# Patient Record
Sex: Male | Born: 1985 | Race: Black or African American | Hispanic: No | Marital: Married | Smoking: Current every day smoker
Health system: Southern US, Community
[De-identification: ages and names within clinical notes are randomized; demographics above are authoritative.]

---
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
## 2012-05-29 ENCOUNTER — Emergency Department (HOSPITAL_COMMUNITY)
Admission: EM | Admit: 2012-05-29 | Discharge: 2012-05-29 | Disposition: A | Discharge status: Home / Self Care | Payer: 59 | Source: Home / Self Care

## 2012-05-29 ENCOUNTER — Encounter (HOSPITAL_COMMUNITY): Payer: Self-pay | Admitting: Emergency Medicine

## 2012-05-29 DIAGNOSIS — M778 Other enthesopathies, not elsewhere classified: Secondary | ICD-10-CM

## 2012-05-29 DIAGNOSIS — M65849 Other synovitis and tenosynovitis, unspecified hand: Secondary | ICD-10-CM

## 2012-05-29 DIAGNOSIS — M65839 Other synovitis and tenosynovitis, unspecified forearm: Secondary | ICD-10-CM

## 2012-05-29 NOTE — ED Provider Notes (Signed)
Medical screening examination/treatment/procedure(s) were performed by non-physician practitioner and as supervising physician I was immediately available for consultation/collaboration.  Leslee Home, M.D.   Reuben Likes, MD 05/29/12 [phone]

## 2012-05-29 NOTE — ED Provider Notes (Signed)
History     CSN: [id number]  Arrival date & time 05/29/12  1256   First MD Initiated Contact with Patient 05/29/12 1358      Chief Complaint  Patient presents with  . Hand Pain    (Consider location/radiation/quality/duration/timing/severity/associated sxs/prior treatment) HPI Comments: 26 year old male was playing basketball yesterday and during play he began to experience pain in the back of the right hand the thenar eminence and the wrist. Overnight and as the day progressed today the pain persisted and swelling developed in the dorsum of the hand. He is unsure of the mechanism of injury. He states that there was no overt stretching or blunt trauma or fall or other known injury to the extremity.   History reviewed. No pertinent past medical history.  History reviewed. No pertinent past surgical history.  No family history on file.  History  Substance Use Topics  . Smoking status: Current Every Day Smoker -- 0.5 packs/day    Types: Cigarettes  . Smokeless tobacco: Not on file  . Alcohol Use: No      Review of Systems  Constitutional: Negative.   Respiratory: Negative.   Gastrointestinal: Negative.   Genitourinary: Negative.   Musculoskeletal:       As per HPI  Skin: Negative.   Neurological: Negative for dizziness, weakness, numbness and headaches.  Psychiatric/Behavioral: Negative.     Allergies  Coconut flavor  Home Medications  No current outpatient prescriptions on file.  BP 123/74  Pulse 72  Temp 99 F (37.2 C) (Oral)  Resp 18  SpO2 98%  Physical Exam  Nursing note and vitals reviewed. Constitutional: He is oriented to person, place, and time. He appears well-developed and well-nourished.  HENT:  Head: Normocephalic and atraumatic.  Eyes: EOM are normal. Left eye exhibits no discharge.  Neck: Normal range of motion. Neck supple.  Pulmonary/Chest: Effort normal.  Musculoskeletal:       There is pain and tenderness and mild edema in the dorsum  of the hand as well as the volar and extensor surface of the wrist. There is no edema over the wrist. No bony tenderness or deformity. Mild tenderness over the thenar eminence There is full range of motion of the digits and the thumb and the wrist. Distal neurovascular motor sensory is intact radial pulses 2+.  Neurological: He is alert and oriented to person, place, and time. No cranial nerve deficit.  Skin: Skin is warm and dry.  Psychiatric: He has a normal mood and affect.    ED Course  Procedures (including critical care time)  Labs Reviewed - No data to display No results found.   1. Tendinitis of left wrist   2. Tendinitis of right hand       MDM  Place right wrist in a Velcro wrist splint. Wear this for 7-10 days possibly longer if still having hand or wrist pain. Ice over the dorsum of the hand at the wrist periodically. Motrin 600 mg every 6 hours p.c. when necessary pain May return if not improved or may follow up with the on-call orthopedist, see name a referral.         Hayden Rasmussen, NP 05/29/12 1525  Hayden Rasmussen, NP 05/29/12 1525

## 2012-05-29 NOTE — ED Notes (Addendum)
Pt c/o right hand pain since yesterday... First noticed after basketball game that he was having pain when tieing shoe and putting on jacket... Sx include: swelling, pain w/activity, limited range of motion... Denies: inj/trauma to site, fevers, vomiting, nauseas, diarrhea... Pt is alert w/no signs of distress.

## 2013-07-10 ENCOUNTER — Emergency Department (INDEPENDENT_AMBULATORY_CARE_PROVIDER_SITE_OTHER): Payer: 59

## 2013-07-10 ENCOUNTER — Encounter (HOSPITAL_COMMUNITY): Payer: Self-pay | Admitting: Emergency Medicine

## 2013-07-10 ENCOUNTER — Emergency Department (HOSPITAL_COMMUNITY)
Admission: EM | Admit: 2013-07-10 | Discharge: 2013-07-10 | Disposition: A | Discharge status: Home / Self Care | Payer: 59 | Source: Home / Self Care | Attending: Emergency Medicine | Admitting: Emergency Medicine

## 2013-07-10 DIAGNOSIS — J111 Influenza due to unidentified influenza virus with other respiratory manifestations: Secondary | ICD-10-CM

## 2013-07-10 LAB — POCT RAPID STREP A: Streptococcus, Group A Screen (Direct): NEGATIVE

## 2013-07-10 MED ORDER — ACETAMINOPHEN 325 MG PO TABS
650.0000 mg | ORAL_TABLET | Freq: Once | ORAL | Status: AC
  Administered 2013-07-10: 650 mg via ORAL

## 2013-07-10 MED ORDER — OSELTAMIVIR PHOSPHATE 75 MG PO CAPS: 75.0000 mg | ORAL_CAPSULE | Freq: Two times a day (BID) | ORAL | Status: DC

## 2013-07-10 MED ORDER — TRAMADOL HCL 50 MG PO TABS: 100.0000 mg | ORAL_TABLET | Freq: Three times a day (TID) | ORAL | Status: DC | PRN

## 2013-07-10 MED ORDER — HYDROCOD POLST-CHLORPHEN POLST 10-8 MG/5ML PO LQCR: 5.0000 mL | Freq: Two times a day (BID) | ORAL | Status: DC | PRN

## 2013-07-10 MED ORDER — ACETAMINOPHEN 325 MG PO TABS
ORAL_TABLET | ORAL | Status: AC
  Filled 2013-07-10: qty 2

## 2013-07-10 NOTE — ED Provider Notes (Signed)
Chief Complaint   Chief Complaint  Patient presents with  . URI    History of Present Illness   Demeco Ducksworth IV is a 27 year old male who had sudden onset yesterday of myalgias, headache, felt hot and cold, and had a temperature of 101.5, chills, and sweats. He also had nausea, diarrhea, dry cough, sore throat, and nasal congestion. He denies any vomiting or abdominal pain. 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  Review of Systems   Other than as noted above, the patient denies any of the following symptoms: Systemic:  No fevers, chills, sweats, or myalgias. Eye:  No redness or discharge. ENT:  No ear pain, headache, nasal congestion, drainage, sinus pressure, or sore throat. Neck:  No neck pain, stiffness, or swollen glands. Lungs:  No cough, sputum production, hemoptysis, wheezing, chest tightness, shortness of breath or chest pain. GI:  No abdominal pain, nausea, vomiting or diarrhea.  PMFSH   Past medical history, family history, social history, meds, and allergies were reviewed.  Physical exam   Vital signs:  BP 128/80  Pulse 110  Temp(Src) 101.5 F (38.6 C) (Oral)  Resp 18  SpO2 100% General:  Alert and oriented.  In no distress.  Skin warm and dry. Eye:  No conjunctival injection or drainage. Lids were normal. ENT:  TMs and canals were normal, without erythema or inflammation.  Nasal mucosa was clear and uncongested, without drainage.  Mucous membranes were moist.  Pharynx was clear with no exudate or drainage.  There were no oral ulcerations or lesions. Neck:  Supple, no adenopathy, tenderness or mass. Lungs:  No respiratory distress.  Lungs were clear to auscultation, without wheezes, rales or rhonchi.  Breath sounds were clear and equal bilaterally.  Heart:  Regular rhythm, without gallops, murmers or rubs. Skin:  Clear, warm, and dry, without rash or lesions.  Labs   Results for orders placed during the hospital  encounter of 07/10/13  POCT RAPID STREP A ([facility] ONLY)      Result Value Range   Streptococcus, Group A Screen (Direct) NEGATIVE  NEGATIVE     Radiology    Dg Chest 2 View  07/10/2013   CLINICAL DATA:  Cough and congestion  EXAM: CHEST  2 VIEW  COMPARISON:  None.  FINDINGS: The lungs are clear. Heart size and pulmonary vascularity are normal. No adenopathy. No bone lesions. There is an azygos lobe on the right, an anatomic variant.  IMPRESSION: No edema or consolidation.   Electronically Signed   By: Bretta Bang M.D.   On: 07/10/2013 16:20   Assessment     The encounter diagnosis was Influenza-like illness.   Plan    1.  Meds:  The following meds were prescribed:   Discharge Medication List as of 07/10/2013  4:46 PM    START taking these medications   Details  chlorpheniramine-HYDROcodone (TUSSIONEX) 10-8 MG/5ML LQCR Take 5 mLs by mouth every 12 (twelve) hours as needed for cough., Starting 07/10/2013, Until Discontinued, Normal    oseltamivir (TAMIFLU) 75 MG capsule Take 1 capsule (75 mg total) by mouth every 12 (twelve) hours., Starting 07/10/2013, Until Discontinued, Normal    traMADol (ULTRAM) 50 MG tablet Take 2 tablets (100 mg total) by mouth every 8 (eight) hours as needed., Starting 07/10/2013, Until Discontinued, Normal        2.  Patient Education/Counseling:  The patient was given appropriate handouts, self care instructions, and instructed in  symptomatic relief.  Instructed to get extra fluids, rest, and use a cool mist vaporizer.   3.  Follow up:  The patient was told to follow up here if no better in 3 to 4 days, or sooner if becoming worse in any way, and given some red flag symptoms such as increasing fever, difficulty breathing, chest pain, or persistent vomiting which would prompt immediate return.  Follow up here as needed.      Reuben Likes, MD 07/10/13 [phone]

## 2013-07-10 NOTE — ED Notes (Signed)
Generalized aching, particularly back and legs , reports fever, cough and minimally sore throat.

## 2013-07-12 LAB — CULTURE, GROUP A STREP

## 2017-06-30 ENCOUNTER — Emergency Department (HOSPITAL_COMMUNITY)
Admission: EM | Admit: 2017-06-30 | Discharge: 2017-06-30 | Disposition: A | Discharge status: Home / Self Care | Payer: No Typology Code available for payment source | Attending: Emergency Medicine | Admitting: Emergency Medicine

## 2017-06-30 ENCOUNTER — Encounter (HOSPITAL_COMMUNITY): Payer: Self-pay

## 2017-06-30 ENCOUNTER — Emergency Department (HOSPITAL_COMMUNITY): Payer: No Typology Code available for payment source

## 2017-06-30 DIAGNOSIS — Y939 Activity, unspecified: Secondary | ICD-10-CM | POA: Insufficient documentation

## 2017-06-30 DIAGNOSIS — F17210 Nicotine dependence, cigarettes, uncomplicated: Secondary | ICD-10-CM | POA: Diagnosis not present

## 2017-06-30 DIAGNOSIS — S161XXA Strain of muscle, fascia and tendon at neck level, initial encounter: Secondary | ICD-10-CM | POA: Diagnosis not present

## 2017-06-30 DIAGNOSIS — Y92410 Unspecified street and highway as the place of occurrence of the external cause: Secondary | ICD-10-CM | POA: Diagnosis not present

## 2017-06-30 DIAGNOSIS — Y998 Other external cause status: Secondary | ICD-10-CM | POA: Insufficient documentation

## 2017-06-30 DIAGNOSIS — S199XXA Unspecified injury of neck, initial encounter: Secondary | ICD-10-CM | POA: Diagnosis present

## 2017-06-30 MED ORDER — CYCLOBENZAPRINE HCL 10 MG PO TABS: 10.0000 mg | ORAL_TABLET | Freq: Two times a day (BID) | ORAL | 0 refills | Status: DC | PRN

## 2017-06-30 MED ORDER — NAPROXEN 500 MG PO TABS: 500.0000 mg | ORAL_TABLET | Freq: Two times a day (BID) | ORAL | 0 refills | Status: DC
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
## 2017-06-30 NOTE — Discharge Instructions (Signed)
Please read the attached information regarding your condition. Take Flexeril and naproxen as directed. Apply heat and stretch area of neck as tolerated. [facility]. Return to ED for worsening symptoms, additional injuries, head injuries, loss of consciousness, chest pain or shortness of breath.

## 2017-12-04 ENCOUNTER — Encounter (HOSPITAL_BASED_OUTPATIENT_CLINIC_OR_DEPARTMENT_OTHER): Payer: Self-pay

## 2017-12-04 ENCOUNTER — Other Ambulatory Visit: Payer: Self-pay

## 2017-12-04 ENCOUNTER — Emergency Department (HOSPITAL_BASED_OUTPATIENT_CLINIC_OR_DEPARTMENT_OTHER)
Admission: EM | Admit: 2017-12-04 | Discharge: 2017-12-04 | Disposition: A | Discharge status: Home / Self Care | Payer: No Typology Code available for payment source | Attending: Emergency Medicine | Admitting: Emergency Medicine

## 2017-12-04 DIAGNOSIS — Z79899 Other long term (current) drug therapy: Secondary | ICD-10-CM | POA: Insufficient documentation

## 2017-12-04 DIAGNOSIS — R591 Generalized enlarged lymph nodes: Secondary | ICD-10-CM

## 2017-12-04 DIAGNOSIS — F17210 Nicotine dependence, cigarettes, uncomplicated: Secondary | ICD-10-CM | POA: Diagnosis not present

## 2017-12-04 DIAGNOSIS — R1030 Lower abdominal pain, unspecified: Secondary | ICD-10-CM | POA: Diagnosis not present

## 2017-12-04 NOTE — ED Notes (Signed)
Bilateral groin aching at work

## 2017-12-04 NOTE — ED Notes (Signed)
ED Provider at bedside. 

## 2017-12-04 NOTE — ED Triage Notes (Addendum)
Pt c/o bilat groin pain-started after lifting at work approx 1130pm last night-NAD-steady gait-states he does not need UDS

## 2017-12-04 NOTE — ED Notes (Signed)
Pt verbalizes understanding of d/c instructions and denies any further needs at this time. 

## 2017-12-05 NOTE — ED Provider Notes (Signed)
[facility] EMERGENCY DEPARTMENT Provider Note   CSN: [id number] Arrival date & time: 12/04/17  2204     History   Chief Complaint Chief Complaint  Patient presents with  . Groin Pain    HPI Dylan Kemp is a 32 y.o. male.  HPI Patient is a 32 year old male who reports pain in his bilateral groin over the past several days.  He states is been worse with walking and worse with sitting down.  He feels as though he has swollen lymph nodes.  This became more painful while at work tonight.  No fevers or chills.  No dysuria.  No scrotal pain or swelling.  Denies abdominal pain.  No weight changes.  No history of cancer.   History reviewed. No pertinent past medical history.  There are no active problems to display for this patient.   History reviewed. No pertinent surgical history.      Home Medications    Prior to Admission medications   Medication Sig Start Date End Date Taking? Authorizing Provider  chlorpheniramine-HYDROcodone (TUSSIONEX) 10-8 MG/5ML LQCR Take 5 mLs by mouth every 12 (twelve) hours as needed for cough. 07/10/13   Reuben Likes, MD  cyclobenzaprine (FLEXERIL) 10 MG tablet Take 1 tablet (10 mg total) by mouth 2 (two) times daily as needed for muscle spasms. 06/30/17   Khatri, Hina, PA-C  naproxen (NAPROSYN) 500 MG tablet Take 1 tablet (500 mg total) by mouth 2 (two) times daily. 06/30/17   Khatri, Hina, PA-C  oseltamivir (TAMIFLU) 75 MG capsule Take 1 capsule (75 mg total) by mouth every 12 (twelve) hours. 07/10/13   Reuben Likes, MD  traMADol (ULTRAM) 50 MG tablet Take 2 tablets (100 mg total) by mouth every 8 (eight) hours as needed. 07/10/13   Reuben Likes, MD    Family History No family history on file.  Social History Social History   Tobacco Use  . Smoking status: Current Every Day Smoker    Packs/day: 0.50    Types: Cigarettes  . Smokeless tobacco: Never Used  Substance Use Topics  . Alcohol use: No  . Drug use: No       Allergies   Coconut flavor [flavoring agent]   Review of Systems Review of Systems  All other systems reviewed and are negative.    Physical Exam Updated Vital Signs BP 130/74 (BP Location: Left Arm)   Pulse 84   Temp 98.2 F (36.8 C) (Oral)   Resp 16   Ht  (1.753 m)   Wt 71.7 kg (158 lb)   SpO2 97%   BMI 23.33 kg/m   Physical Exam  Constitutional: He is oriented to person, place, and time. He appears well-developed and well-nourished.  HENT:  Head: Normocephalic.  Eyes: EOM are normal.  Neck: Normal range of motion.  Pulmonary/Chest: Effort normal.  Abdominal: He exhibits no distension.  Genitourinary:  Genitourinary Comments: Normal external genitalia.  Circumcised penis.  Scrotum normal.  No hernias palpable bilaterally.  Musculoskeletal: Normal range of motion.  Full range of motion of lower extremity major joints.  Normal pulses in his bilateral feet.  Bilateral inguinal lymphadenopathy present.  This is shoddy and small.  No overlying skin changes.  Equal bilaterally  Neurological: He is alert and oriented to person, place, and time.  Psychiatric: He has a normal mood and affect.  Nursing note and vitals reviewed.    ED Treatments / Results  Labs (all labs ordered are listed, but only abnormal  results are displayed) Labs Reviewed - No data to display  EKG None  Radiology No results found.  Procedures Procedures (including critical care time)  Medications Ordered in ED Medications - No data to display   Initial Impression / Assessment and Plan / ED Course  I have reviewed the triage vital signs and the nursing notes.  Pertinent labs & imaging results that were available during my care of the patient were reviewed by me and considered in my medical decision making (see chart for details).     Bilateral inguinal lymphadenopathy.  Nonspecific.  Recommend anti-inflammatories and time.  Primary care follow-up.  If his symptoms persist he  may benefit from a biopsy.  At this point I do not think he needs blood work.  Is only been present for several days.  If this persists will benefit from blood work as well  Final Clinical Impressions(s) / ED Diagnoses   Final diagnoses:  Lymphadenopathy    ED Discharge Orders    None       Azalia Bilis, MD 12/05/17 [phone]

## 2018-05-22 ENCOUNTER — Emergency Department (HOSPITAL_COMMUNITY)
Admission: EM | Admit: 2018-05-22 | Discharge: 2018-05-22 | Disposition: A | Discharge status: Home / Self Care | Payer: Self-pay | Attending: Emergency Medicine | Admitting: Emergency Medicine

## 2018-05-22 ENCOUNTER — Other Ambulatory Visit: Payer: Self-pay

## 2018-05-22 ENCOUNTER — Encounter (HOSPITAL_COMMUNITY): Payer: Self-pay | Admitting: Emergency Medicine

## 2018-05-22 DIAGNOSIS — F17210 Nicotine dependence, cigarettes, uncomplicated: Secondary | ICD-10-CM | POA: Insufficient documentation

## 2018-05-22 DIAGNOSIS — M545 Low back pain, unspecified: Secondary | ICD-10-CM

## 2018-05-22 DIAGNOSIS — Z79899 Other long term (current) drug therapy: Secondary | ICD-10-CM | POA: Insufficient documentation

## 2018-05-22 MED ORDER — CYCLOBENZAPRINE HCL 10 MG PO TABS: 10.0000 mg | ORAL_TABLET | Freq: Two times a day (BID) | ORAL | 0 refills | Status: AC | PRN

## 2018-05-22 MED ORDER — NAPROXEN 500 MG PO TABS: 500.0000 mg | ORAL_TABLET | Freq: Two times a day (BID) | ORAL | 0 refills | Status: AC

## 2018-05-22 MED ORDER — HYDROCODONE-ACETAMINOPHEN 5-325 MG PO TABS: 1.0000 | ORAL_TABLET | Freq: Four times a day (QID) | ORAL | 0 refills | Status: AC | PRN

## 2018-05-22 NOTE — ED Provider Notes (Signed)
[facility] EMERGENCY DEPARTMENT Provider Note   CSN: [id number] Arrival date & time: 05/22/18  2019     History   Chief Complaint Chief Complaint  Patient presents with  . Back Pain    HPI Dylan Kemp is a 32 y.o. male.  Patient complaint of low back pain midline nonradiating starting Tuesday or Wednesday.  No injury or work-related injury.  Patient states he been trying to treat the pain with icy hot patches.  He went into work tonight it was difficult for him to perform his work tasks he was sent in for further evaluation.  Patient has not had any back problems in the past.  Patient denies any numbness or weakness to his feet.  Pain does not radiate into his legs.  No difficulty urinating.     History reviewed. No pertinent past medical history.  There are no active problems to display for this patient.   History reviewed. No pertinent surgical history.      Home Medications    Prior to Admission medications   Medication Sig Start Date End Date Taking? Authorizing Provider  ibuprofen (ADVIL,MOTRIN) 200 MG tablet Take 200 mg by mouth every 6 (six) hours as needed for mild pain or moderate pain.   Yes [provider]  cyclobenzaprine (FLEXERIL) 10 MG tablet Take 1 tablet (10 mg total) by mouth 2 (two) times daily as needed for muscle spasms. 05/22/18   Vanetta Mulders, MD  HYDROcodone-acetaminophen (NORCO/VICODIN) 5-325 MG tablet Take 1-2 tablets by mouth every 6 (six) hours as needed. 05/22/18   Vanetta Mulders, MD  naproxen (NAPROSYN) 500 MG tablet Take 1 tablet (500 mg total) by mouth 2 (two) times daily. 05/22/18   Vanetta Mulders, MD    Family History No family history on file.  Social History Social History   Tobacco Use  . Smoking status: Current Every Day Smoker    Packs/day: 0.50    Types: Cigarettes  . Smokeless tobacco: Never Used  Substance Use Topics  . Alcohol use: No  . Drug use: No     Allergies   Coconut flavor  [flavoring agent]   Review of Systems Review of Systems  Constitutional: Negative for fever.  HENT: Negative for congestion.   Eyes: Negative for redness.  Respiratory: Negative for shortness of breath.   Cardiovascular: Negative for chest pain.  Gastrointestinal: Negative for abdominal pain.  Genitourinary: Negative for difficulty urinating.  Musculoskeletal: Positive for back pain.  Skin: Negative for rash.  Neurological: Negative for weakness and numbness.  Hematological: Does not bruise/bleed easily.  Psychiatric/Behavioral: Negative for confusion.     Physical Exam Updated Vital Signs BP 130/75 (BP Location: Right Arm)   Pulse 92   Temp 97.8 F (36.6 C) (Oral)   Resp 18   Wt 72.6 kg   SpO2 98%   BMI 23.63 kg/m   Physical Exam  Constitutional: He is oriented to person, place, and time. He appears well-developed and well-nourished. No distress.  HENT:  Head: Normocephalic and atraumatic.  Mouth/Throat: Oropharynx is clear and moist.  Eyes: Pupils are equal, round, and reactive to light. Conjunctivae and EOM are normal.  Neck: Normal range of motion. Neck supple.  Cardiovascular: Normal rate and regular rhythm.  Pulmonary/Chest: Effort normal and breath sounds normal.  Abdominal: Soft. Bowel sounds are normal. There is no tenderness.  Musculoskeletal: Normal range of motion. He exhibits no edema.  Neurological: He is alert and oriented to person, place, and time. No cranial nerve deficit  or sensory deficit. He exhibits normal muscle tone. Coordination normal.  Skin: Skin is warm.  Nursing note and vitals reviewed.    ED Treatments / Results  Labs (all labs ordered are listed, but only abnormal results are displayed) Labs Reviewed - No data to display  EKG None  Radiology No results found.  Procedures Procedures (including critical care time)  Medications Ordered in ED Medications - No data to display   Initial Impression / Assessment and Plan / ED  Course  I have reviewed the triage vital signs and the nursing notes.  Pertinent labs & imaging results that were available during my care of the patient were reviewed by me and considered in my medical decision making (see chart for details).     Patient with complaint of lumbar back pain midline since Tuesday or Wednesday.  No trauma or injury.  Patient denies any radiation of pain in the legs no numbness or weakness to lower extremities or feet.  No prior history of any back problems.  Patient has been using icy hot patches without any significant improvement.  Patient went to work tonight and in the back to much so he was sent in for further evaluation.  Patient will be treated with a seven-day course of Naprosyn and Flexeril and a short course of hydrocodone.  Work note provided.  Final Clinical Impressions(s) / ED Diagnoses   Final diagnoses:  Acute midline low back pain without sciatica    ED Discharge Orders         Ordered    naproxen (NAPROSYN) 500 MG tablet  2 times daily     05/22/18 2144    cyclobenzaprine (FLEXERIL) 10 MG tablet  2 times daily PRN     05/22/18 2144    HYDROcodone-acetaminophen (NORCO/VICODIN) 5-325 MG tablet  Every 6 hours PRN     05/22/18 2144           Vanetta Mulders, MD 05/22/18 2151

## 2018-05-22 NOTE — ED Triage Notes (Signed)
Pt c/o lower center back pain x 4 days, denies injury, has had some relief with [facility] patches

## 2018-05-22 NOTE — Discharge Instructions (Addendum)
Take the Naprosyn on a regular basis for the next 7 days.  Take the Flexeril as directed but definitely take it at nighttime.  It will make you sleepy.  Help you relax.  Take the hydrocodone as needed for additional pain relief.  Work note provided to be out of work until Tuesday.  Return for any new or worse symptoms.  If symptoms persist additional work-up with x-rays of the back or possible MRI of the back would be appropriate.

## 2018-12-24 ENCOUNTER — Emergency Department (HOSPITAL_COMMUNITY)
Admission: EM | Admit: 2018-12-24 | Discharge: 2018-12-24 | Disposition: A | Discharge status: Home / Self Care | Payer: No Typology Code available for payment source | Attending: Emergency Medicine | Admitting: Emergency Medicine

## 2018-12-24 ENCOUNTER — Encounter (HOSPITAL_COMMUNITY): Payer: Self-pay | Admitting: Emergency Medicine

## 2018-12-24 ENCOUNTER — Other Ambulatory Visit: Payer: Self-pay

## 2018-12-24 ENCOUNTER — Emergency Department (HOSPITAL_COMMUNITY): Payer: No Typology Code available for payment source

## 2018-12-24 DIAGNOSIS — X500XXA Overexertion from strenuous movement or load, initial encounter: Secondary | ICD-10-CM | POA: Insufficient documentation

## 2018-12-24 DIAGNOSIS — S46911A Strain of unspecified muscle, fascia and tendon at shoulder and upper arm level, right arm, initial encounter: Secondary | ICD-10-CM | POA: Diagnosis not present

## 2018-12-24 DIAGNOSIS — Y9289 Other specified places as the place of occurrence of the external cause: Secondary | ICD-10-CM | POA: Diagnosis not present

## 2018-12-24 DIAGNOSIS — Y990 Civilian activity done for income or pay: Secondary | ICD-10-CM | POA: Insufficient documentation

## 2018-12-24 DIAGNOSIS — F17210 Nicotine dependence, cigarettes, uncomplicated: Secondary | ICD-10-CM | POA: Diagnosis not present

## 2018-12-24 DIAGNOSIS — S4992XA Unspecified injury of left shoulder and upper arm, initial encounter: Secondary | ICD-10-CM | POA: Diagnosis present

## 2018-12-24 DIAGNOSIS — Y9389 Activity, other specified: Secondary | ICD-10-CM | POA: Insufficient documentation

## 2018-12-24 NOTE — ED Notes (Signed)
Patient transported to X-ray 

## 2018-12-24 NOTE — ED Provider Notes (Signed)
[place] EMERGENCY DEPARTMENT Provider Note   CSN: [id number] Arrival date & time: 12/24/18  1201    History   Chief Complaint Chief Complaint  Patient presents with  . Shoulder Pain    HPI Dylan Kemp is a 33 y.o. male.     HPI    33 year old male presents today with complaints of right shoulder pain.  Patient notes yesterday at work he was lifting boxes when he felt a pull in his right shoulder.  He notes pain with abduction at the shoulder and flexion as well.  He denies any loss of distal sensation strength and motor function.  No medications prior to arrival.    History reviewed. No pertinent past medical history.  There are no active problems to display for this patient.   History reviewed. No pertinent surgical history.      Home Medications    Prior to Admission medications   Medication Sig Start Date End Date Taking? Authorizing Provider  cyclobenzaprine (FLEXERIL) 10 MG tablet Take 1 tablet (10 mg total) by mouth 2 (two) times daily as needed for muscle spasms. 05/22/18   Fredia Sorrow, MD  HYDROcodone-acetaminophen (NORCO/VICODIN) 5-325 MG tablet Take 1-2 tablets by mouth every 6 (six) hours as needed. 05/22/18   Fredia Sorrow, MD  ibuprofen (ADVIL,MOTRIN) 200 MG tablet Take 200 mg by mouth every 6 (six) hours as needed for mild pain or moderate pain.    [provider]  naproxen (NAPROSYN) 500 MG tablet Take 1 tablet (500 mg total) by mouth 2 (two) times daily. 05/22/18   Fredia Sorrow, MD    Family History No family history on file.  Social History Social History   Tobacco Use  . Smoking status: Current Every Day Smoker    Packs/day: 0.50    Types: Cigarettes  . Smokeless tobacco: Never Used  Substance Use Topics  . Alcohol use: No  . Drug use: No     Allergies   Coconut flavor [flavoring agent]   Review of Systems Review of Systems  All other systems reviewed and are negative.     Physical Exam Updated Vital Signs BP 123/73 (BP Location: Left Arm) Comment: Simultaneous filing. User may not have seen previous data. Comment (BP Location): Simultaneous filing. User may not have seen previous data.  Pulse 88 Comment: Simultaneous filing. User may not have seen previous data.  Temp 98.2 F (36.8 C) (Oral)   Resp 16 Comment: Simultaneous filing. User may not have seen previous data.  SpO2 100% Comment: Simultaneous filing. User may not have seen previous data.  Physical Exam Vitals signs and nursing note reviewed.  Constitutional:      Appearance: He is well-developed.  HENT:     Head: Normocephalic and atraumatic.  Eyes:     General: No scleral icterus.       Right eye: No discharge.        Left eye: No discharge.     Conjunctiva/sclera: Conjunctivae normal.     Pupils: Pupils are equal, round, and reactive to light.  Neck:     Musculoskeletal: Normal range of motion.     Vascular: No JVD.     Trachea: No tracheal deviation.  Pulmonary:     Effort: Pulmonary effort is normal.     Breath sounds: No stridor.  Musculoskeletal:     Comments: Right shoulder atraumatic with minimal tenderness over the anterior deltoid-full active range of motion grip strength 5 out of 5 radial pulse 2+  sensation intact  Neurological:     Mental Status: He is alert and oriented to person, place, and time.     Coordination: Coordination normal.  Psychiatric:        Behavior: Behavior normal.        Thought Content: Thought content normal.        Judgment: Judgment normal.      ED Treatments / Results  Labs (all labs ordered are listed, but only abnormal results are displayed) Labs Reviewed - No data to display  EKG None  Radiology Dg Shoulder Right  Result Date: 12/24/2018 CLINICAL DATA:  Pain. EXAM: RIGHT SHOULDER - 2+ VIEW COMPARISON:  None. FINDINGS: There is no evidence of fracture or dislocation. There is no evidence of arthropathy or other focal bone abnormality.  Soft tissues are unremarkable. IMPRESSION: Negative. Electronically Signed   By: Gerome Samavid  Williams III M.D   On: 12/24/2018 12:47    Procedures Procedures (including critical care time)  Medications Ordered in ED Medications - No data to display   Initial Impression / Assessment and Plan / ED Course  I have reviewed the triage vital signs and the nursing notes.  Pertinent labs & imaging results that were available during my care of the patient were reviewed by me and considered in my medical decision making (see chart for details).        Patient here with likely muscular strain.  No signs of acute internal derangement.  Discharged with symptomatic care strict return precautions.  He verbalized understanding and agreement to today's plan.  Final Clinical Impressions(s) / ED Diagnoses   Final diagnoses:  Strain of right shoulder, initial encounter    ED Discharge Orders    None       Rosalio LoudHedges, Artrell Lawless, PA-C 12/24/18 1442    Eber HongMiller, Brian, MD 12/25/18 [phone]

## 2018-12-24 NOTE — Discharge Instructions (Signed)
Please read attached information. If you experience any new or worsening signs or symptoms please return to the emergency room for evaluation. Please follow-up with your primary care provider or specialist as discussed.  °

## 2018-12-24 NOTE — ED Triage Notes (Signed)
Pt. Stated, I work at [employer] and felt my rt. Shoulder pop this happened yesterday .

## 2019-01-16 ENCOUNTER — Emergency Department (HOSPITAL_COMMUNITY): Payer: Self-pay

## 2019-01-16 ENCOUNTER — Emergency Department (HOSPITAL_COMMUNITY)
Admission: EM | Admit: 2019-01-16 | Discharge: 2019-01-17 | Disposition: A | Discharge status: Home / Self Care | Payer: Self-pay | Attending: Emergency Medicine | Admitting: Emergency Medicine

## 2019-01-16 ENCOUNTER — Other Ambulatory Visit: Payer: Self-pay

## 2019-01-16 ENCOUNTER — Encounter (HOSPITAL_COMMUNITY): Payer: Self-pay | Admitting: Emergency Medicine

## 2019-01-16 DIAGNOSIS — M79642 Pain in left hand: Secondary | ICD-10-CM | POA: Insufficient documentation

## 2019-01-16 DIAGNOSIS — F17210 Nicotine dependence, cigarettes, uncomplicated: Secondary | ICD-10-CM | POA: Insufficient documentation

## 2019-01-16 NOTE — ED Triage Notes (Signed)
Patient complains of pain in swelling to the left hand that began last wednesday. Denies known injury.

## 2019-01-17 NOTE — Discharge Instructions (Signed)
Use ice packs for comfort. Take the ibuprofen 600 mg twice a day for pain.  Look at the thumb sprain instructions however you have a sprain of your finger.  Follow up with Dr Aline Brochure if not improving in the next 1-2 weeks.

## 2019-01-17 NOTE — ED Notes (Signed)
Pt given ice pack

## 2019-01-17 NOTE — ED Provider Notes (Signed)
[facility] EMERGENCY DEPARTMENT Provider Note   CSN: [id number] Arrival date & time: 01/16/19  2209  Time seen 12:10 AM  History   Chief Complaint Chief Complaint  Patient presents with  . Hand Pain    HPI [name] is a 33 y.o. male.     HPI patient states around June 24 he noted he started having pain in his left hand.  Patient is right-handed.  He states the pain is located in the webspace between the MCP joints of his little and ring fingers.  He denies any known injury to that area.  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  He has been doing that job for about a year and a half.  He also states a few days ago he moved his family into a new place.  He states there was some swelling and the ring on his ring finger had gotten tight but now it is loose again.  He states at night he sleeps on his hand and he now gets numbness in his hand which he did not do before.  He states that it does not hurt to grip his fingers however if he spreads his fingers apart that causes it to hurt.  He has been taking ibuprofen 600 mg about 5 PM with mild relief.  Patient states he works third shift.  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  Patient denies any family history of significant arthritis.  PCP Patient, No Pcp Per   History reviewed. No pertinent past medical history.  There are no active problems to display for this patient.   History reviewed. No pertinent surgical history.      Home Medications    Prior to Admission medications   Medication Sig Start Date End Date Taking? Authorizing Provider  cyclobenzaprine (FLEXERIL) 10 MG tablet Take 1 tablet (10 mg total) by mouth 2 (two) times daily as needed for muscle spasms. 05/22/18   Fredia Sorrow, MD  HYDROcodone-acetaminophen (NORCO/VICODIN) 5-325 MG tablet Take 1-2 tablets by mouth every 6 (six) hours as needed. 05/22/18   Fredia Sorrow, MD  ibuprofen (ADVIL,MOTRIN) 200  MG tablet Take 200 mg by mouth every 6 (six) hours as needed for mild pain or moderate pain.    [provider]  naproxen (NAPROSYN) 500 MG tablet Take 1 tablet (500 mg total) by mouth 2 (two) times daily. 05/22/18   Fredia Sorrow, MD    Family History History reviewed. No pertinent family history.  Social History Social History   Tobacco Use  . Smoking status: Current Every Day Smoker    Packs/day: 0.50    Types: Cigarettes  . Smokeless tobacco: Never Used  Substance Use Topics  . Alcohol use: No  . Drug use: No  employed   Allergies   Coconut flavor [flavoring agent]   Review of Systems Review of Systems  All other systems reviewed and are negative.    Physical Exam Updated Vital Signs BP 112/84 (BP Location: Right Arm)   Pulse 76   Temp 98.1 F (36.7 C) (Oral)   Resp 16   Ht 5\' 9"  (1.753 m)   Wt 72.6 kg   SpO2 100%   BMI 23.63 kg/m   Vital signs normal    Physical Exam Vitals signs and nursing note reviewed.  Constitutional:      General: He is not in acute distress.    Appearance: Normal appearance.  HENT:  Head: Normocephalic and atraumatic.     Right Ear: External ear normal.     Left Ear: External ear normal.     Nose: Nose normal.  Eyes:     Extraocular Movements: Extraocular movements intact.     Conjunctiva/sclera: Conjunctivae normal.  Neck:     Musculoskeletal: Normal range of motion.  Cardiovascular:     Rate and Rhythm: Normal rate.  Pulmonary:     Effort: Pulmonary effort is normal. No respiratory distress.  Musculoskeletal: Normal range of motion.        General: No swelling, tenderness, deformity or signs of injury.     Comments: Patient is nontender to palpation over the MCP joints of his left hand.  There is no swelling in those areas.  He indicates he is tender mainly in the webspace between the little and ring fingers.  He is able to abduct his fingers however he states it is painful in the same webspace area.   Skin:    General: Skin is warm and dry.     Capillary Refill: Capillary refill takes less than 2 seconds.     Findings: No lesion or rash.  Neurological:     General: No focal deficit present.     Mental Status: He is alert and oriented to person, place, and time.     Cranial Nerves: No cranial nerve deficit.  Psychiatric:        Mood and Affect: Mood normal.        Behavior: Behavior normal.        Thought Content: Thought content normal.      ED Treatments / Results  Labs (all labs ordered are listed, but only abnormal results are displayed) Labs Reviewed - No data to display  EKG None  Radiology Dg Hand Complete Left  Result Date: 01/16/2019 CLINICAL DATA:  Pain and swelling in the left hand. No known injury. EXAM: LEFT HAND - COMPLETE 3+ VIEW COMPARISON:  None. FINDINGS: Normal anatomic alignment. No evidence for acute fracture or dislocation. Regional soft tissues are unremarkable. IMPRESSION: No acute osseous abnormality. Electronically Signed   By: Annia Beltrew  Davis M.D.   On: 01/16/2019 22:36    Procedures Procedures (including critical care time)  Medications Ordered in ED Medications - No data to display   Initial Impression / Assessment and Plan / ED Course  I have reviewed the triage vital signs and the nursing notes.  Pertinent labs & imaging results that were available during my care of the patient were reviewed by me and considered in my medical decision making (see chart for details).      We discussed I feel like he probably has some type of ligamentous or tendon injury and he may have had a abduction of his little finger that he does not recall.  He was encouraged to take the ibuprofen twice a day and to give it a few more days for healing.  He was given a work note for [employer].  Final Clinical Impressions(s) / ED Diagnoses   Final diagnoses:  Pain of left hand    ED Discharge Orders    None    OTC ibuprofen  Plan discharge  Devoria AlbeIva Briselda Naval, MD, Concha PyoFACEP     Tanna Loeffler, MD 01/17/19 [phone]

## 2019-06-15 ENCOUNTER — Other Ambulatory Visit: Payer: Self-pay

## 2019-06-15 DIAGNOSIS — Z20822 Contact with and (suspected) exposure to covid-19: Secondary | ICD-10-CM

## 2019-06-17 ENCOUNTER — Telehealth: Payer: Self-pay | Admitting: *Deleted

## 2019-06-17 LAB — NOVEL CORONAVIRUS, NAA: SARS-CoV-2, NAA: NOT DETECTED

## 2019-06-17 NOTE — Telephone Encounter (Signed)
Pt given result of COVID test from 06/15/2019; he verbalized; link for MyChart sent via text to [phone] per request.

## 2019-12-03 IMAGING — CR RIGHT SHOULDER - 2+ VIEW
2 series · 2 of 2 positions shown · non-contrast
Comparison: None.

CLINICAL DATA: Pain.

EXAM:
RIGHT SHOULDER - 2+ VIEW

[shoulder grashey]
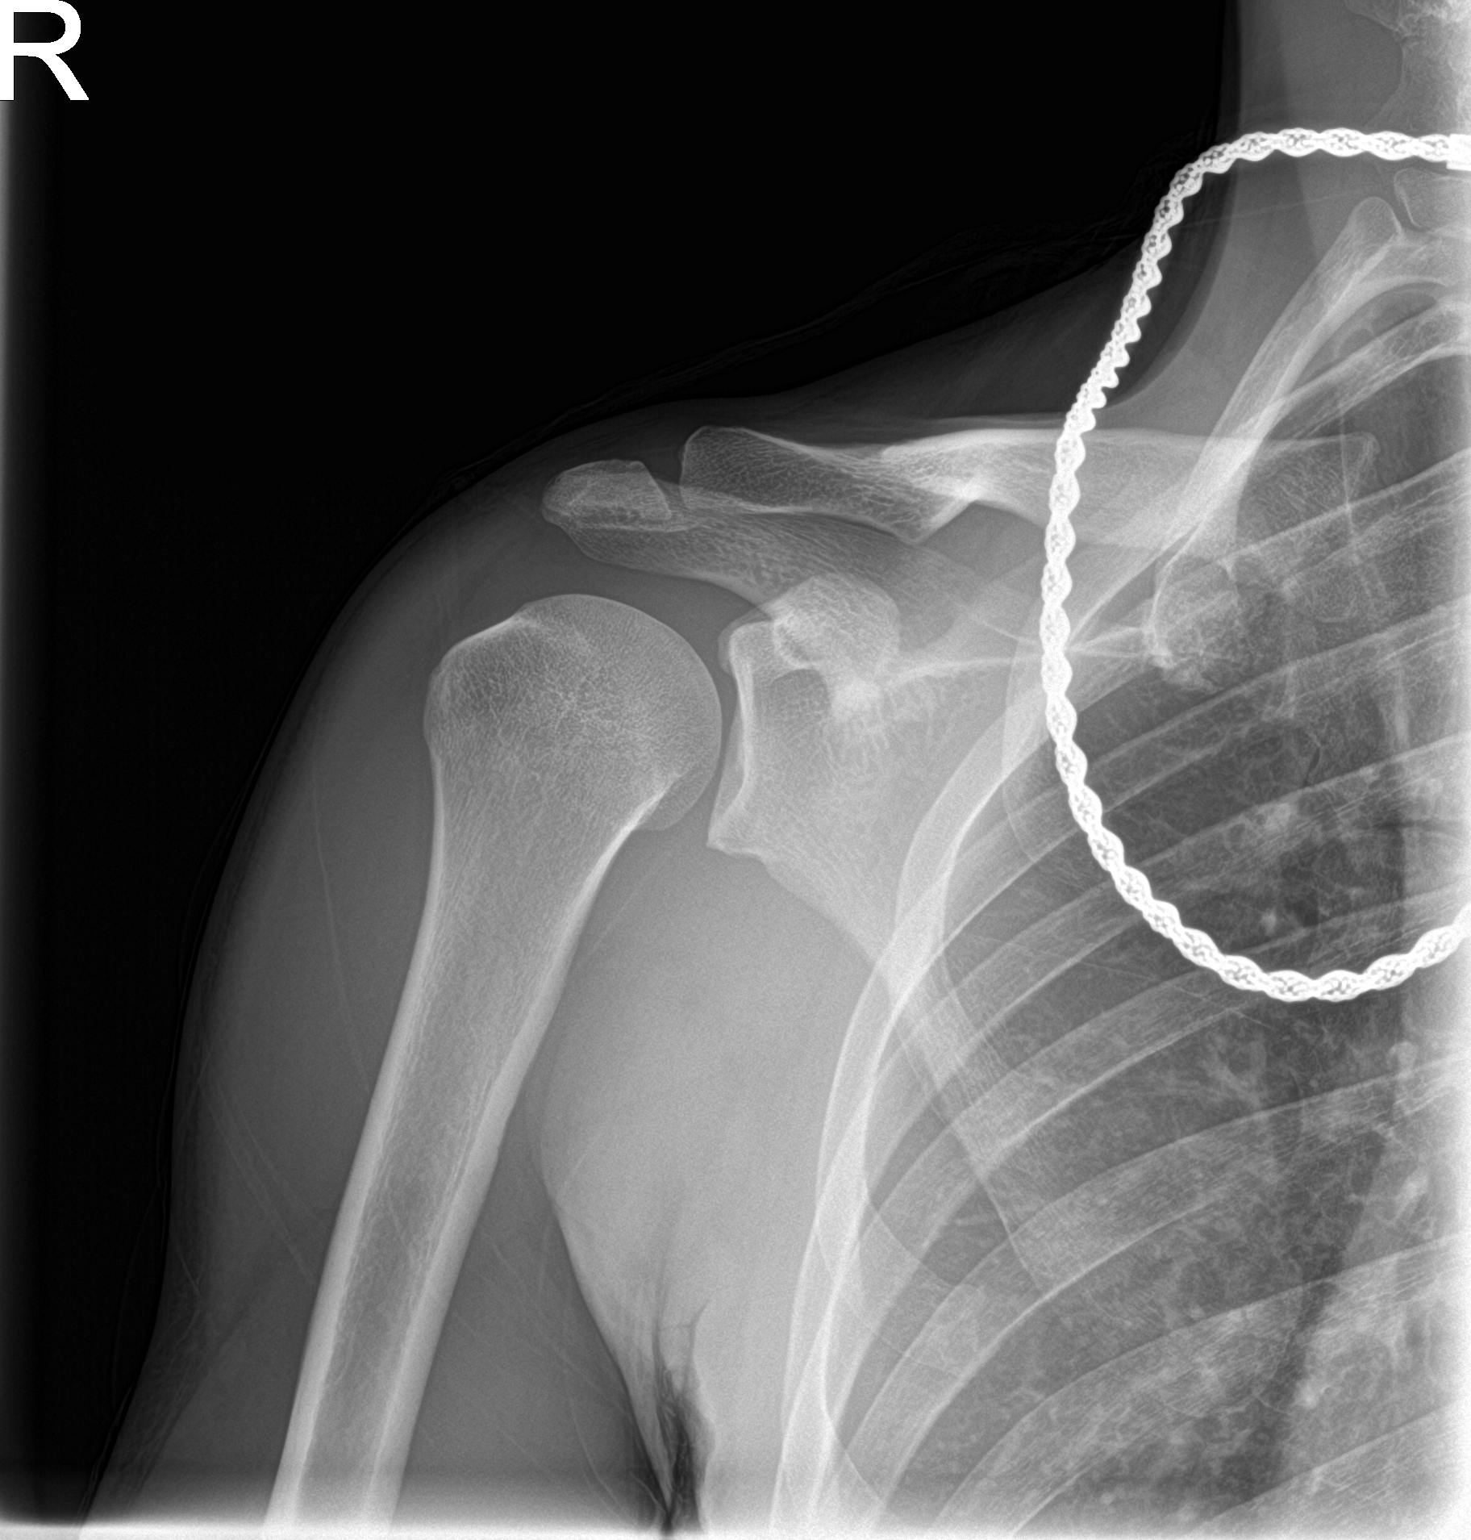

[shoulder y view]
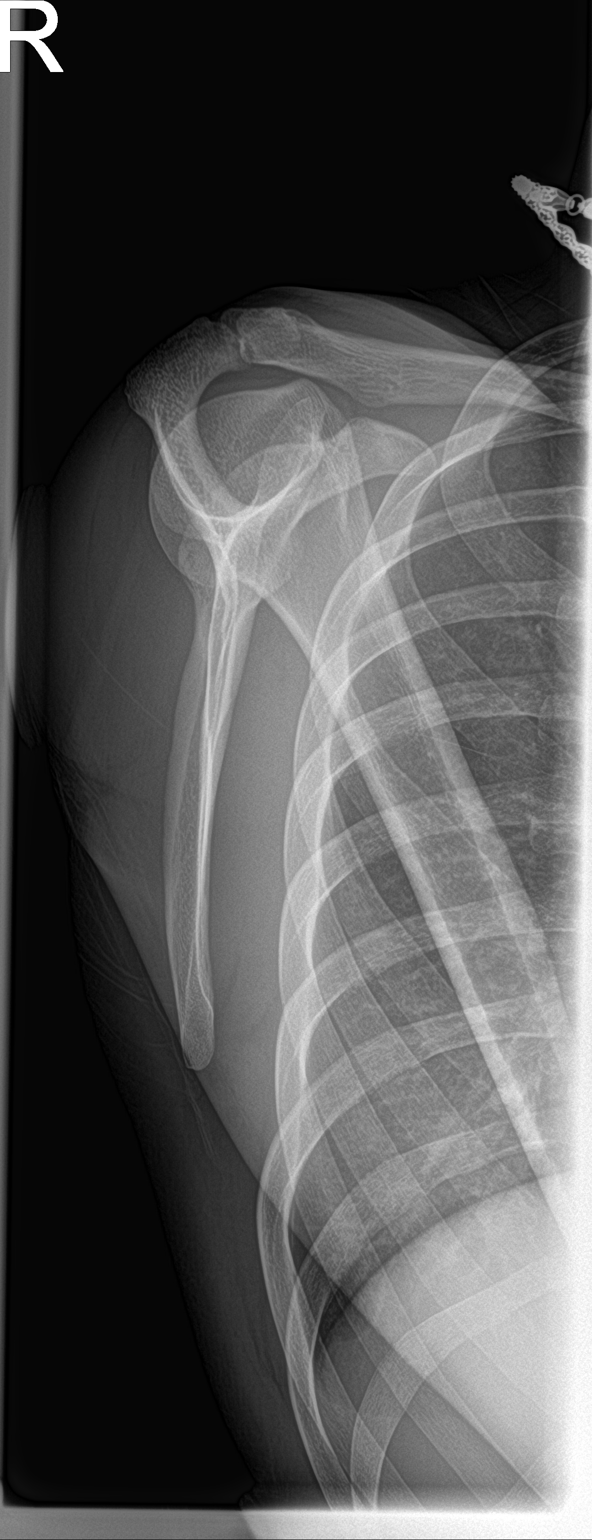

[2 of 2 positions shown; findings below may reference images not displayed]

FINDINGS: There is no evidence of fracture or dislocation. There is no
evidence of arthropathy or other focal bone abnormality. Soft
tissues are unremarkable.
IMPRESSION: Negative.

## 2019-12-26 IMAGING — DX LEFT HAND - COMPLETE 3+ VIEW
3 series · 3 of 3 positions shown · non-contrast
Comparison: None.

CLINICAL DATA: Pain and swelling in the left hand. No known injury.

EXAM:
LEFT HAND - COMPLETE 3+ VIEW

[hand pa]
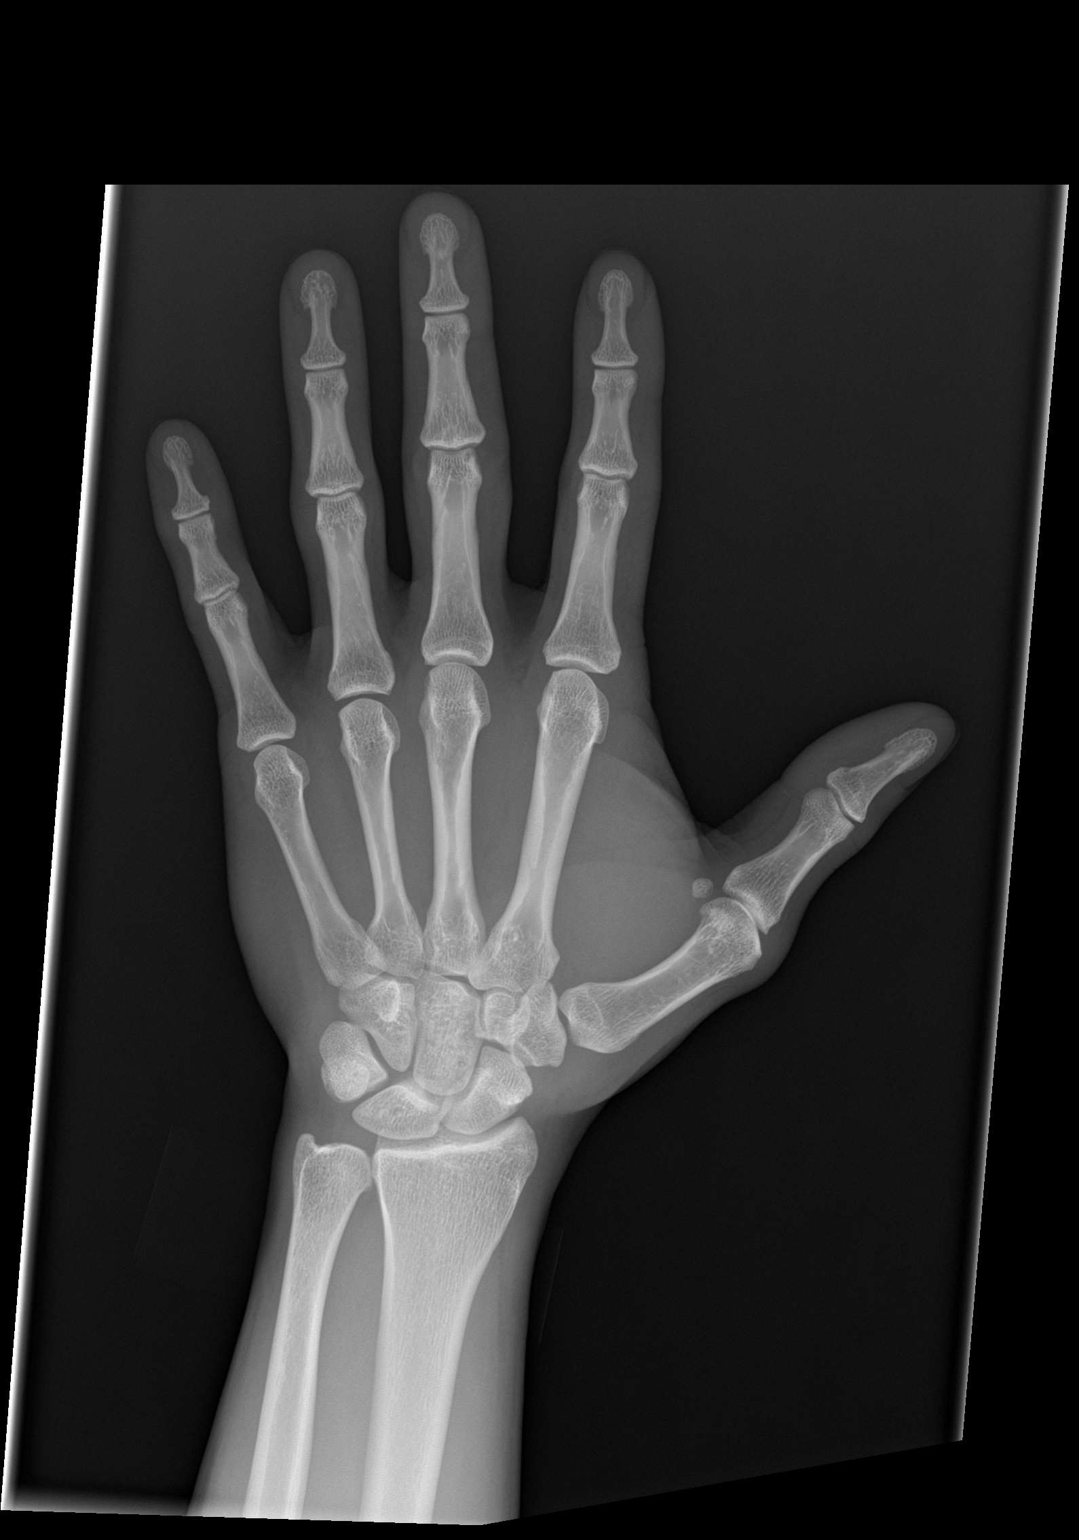

[hand obl]
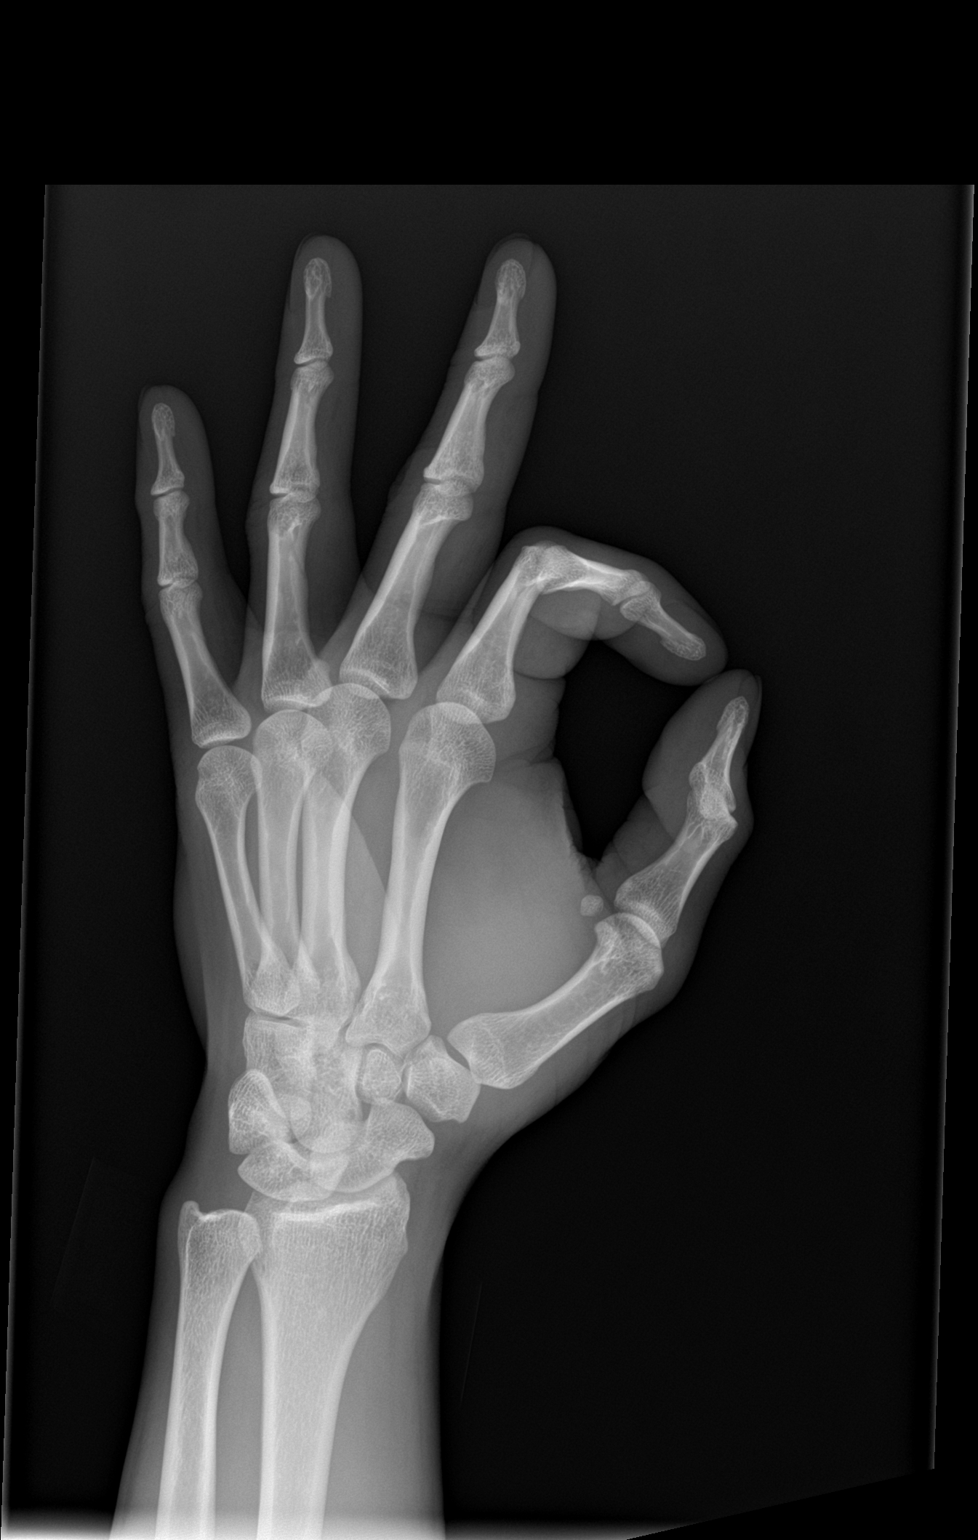

[hand lat]
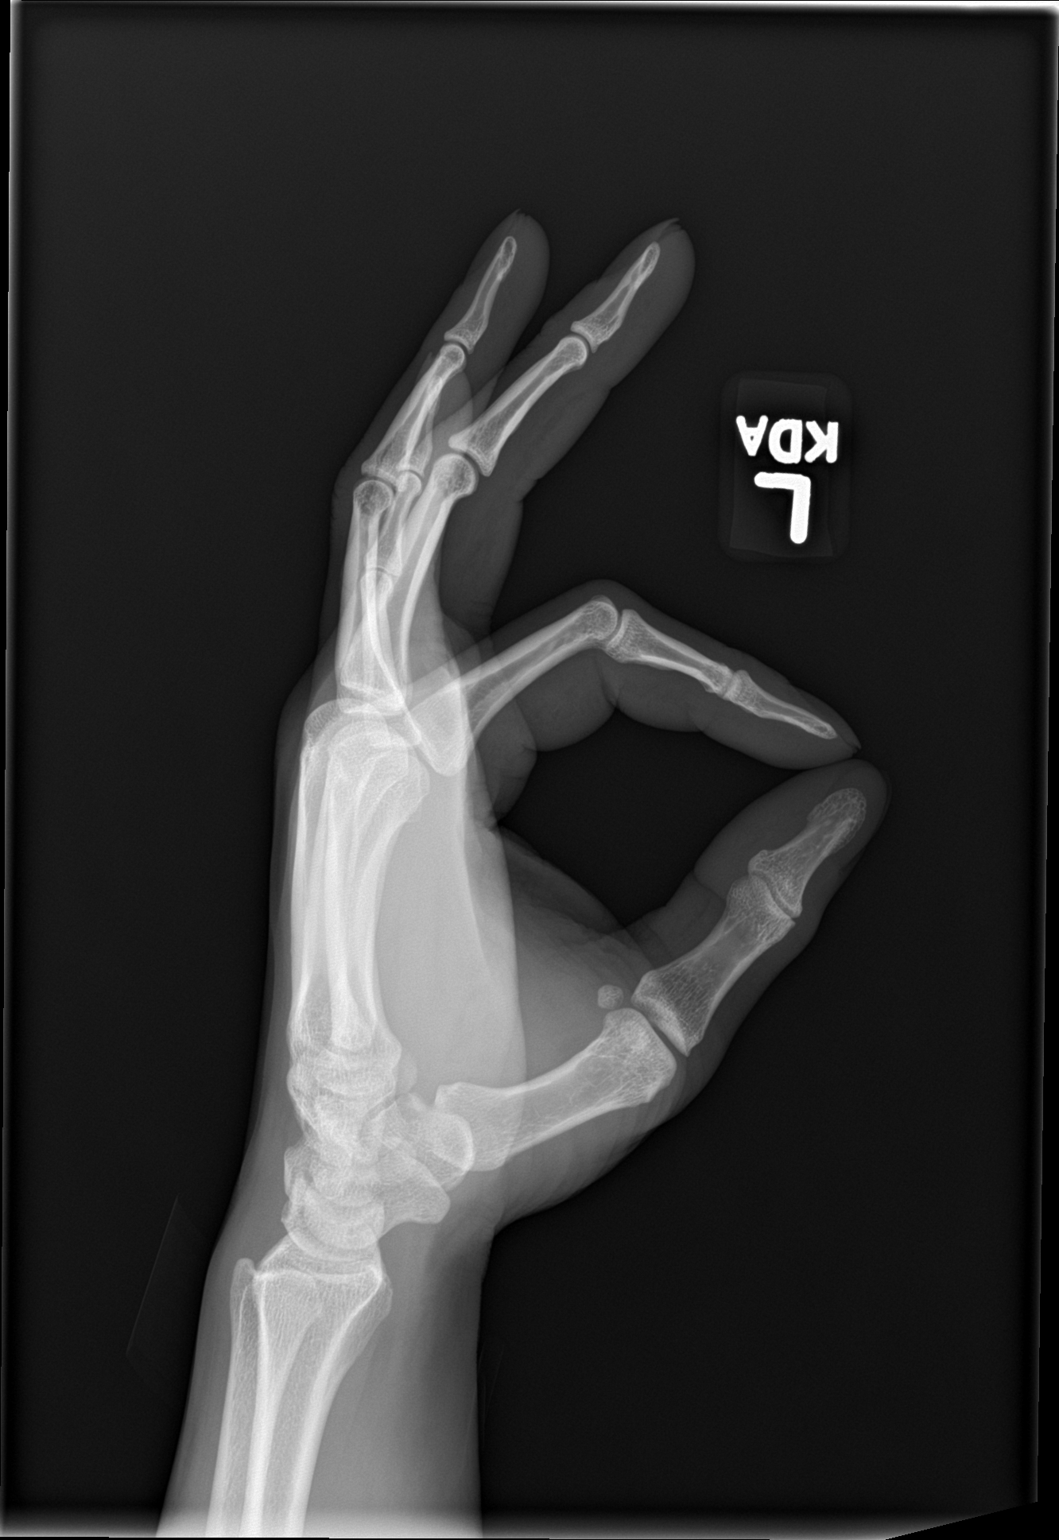

[3 of 3 positions shown; findings below may reference images not displayed]

FINDINGS: Normal anatomic alignment. No evidence for acute fracture or
dislocation. Regional soft tissues are unremarkable.
IMPRESSION: No acute osseous abnormality.
